# Patient Record
Sex: Male | Born: 1950 | Race: White | Hispanic: No | Smoking: Never smoker
Health system: Southern US, Community
[De-identification: ages and names within clinical notes are randomized; demographics above are authoritative.]

## PROBLEM LIST (undated history)

## (undated) DIAGNOSIS — E119 Type 2 diabetes mellitus without complications: Secondary | ICD-10-CM

## (undated) HISTORY — DX: Type 2 diabetes mellitus without complications: E11.9

## (undated) HISTORY — PX: ESOPHAGEAL DILATION: SHX303

---

## 2010-02-28 ENCOUNTER — Inpatient Hospital Stay (HOSPITAL_COMMUNITY): Admission: AC | Admit: 2010-02-28 | Discharge: 2010-03-03 | Payer: Self-pay | Source: Home / Self Care

## 2010-09-26 LAB — MRSA PCR SCREENING: MRSA by PCR: NEGATIVE

## 2010-09-26 LAB — TYPE AND SCREEN

## 2010-09-26 LAB — COMPREHENSIVE METABOLIC PANEL
AST: 132 U/L — ABNORMAL HIGH (ref 0–37)
Alkaline Phosphatase: 70 U/L (ref 39–117)
BUN: 11 mg/dL (ref 6–23)
BUN: 12 mg/dL (ref 6–23)
CO2: 19 meq/L (ref 19–32)
CO2: 21 mEq/L (ref 19–32)
Chloride: 106 mEq/L (ref 96–112)
Creatinine, Ser: 0.8 mg/dL (ref 0.4–1.5)
GFR calc non Af Amer: >60 mL/min (ref >60)
Glucose, Bld: 241 mg/dL — ABNORMAL HIGH (ref 70–99)
Sodium: 139 meq/L (ref 135–145)
Total Bilirubin: 1.2 mg/dL (ref 0.3–1.2)

## 2010-09-26 LAB — POCT I-STAT, CHEM 8
BUN: 12 mg/dL (ref 6–23)
BUN: 13 mg/dL (ref 6–23)
Calcium, Ion: 0.96 mmol/L — ABNORMAL LOW (ref 1.12–1.32)
Calcium, Ion: 0.96 mmol/L — ABNORMAL LOW (ref 1.12–1.32)
Chloride: 108 meq/L (ref 96–112)
Creatinine, Ser: 1.1 mg/dL (ref 0.4–1.5)
Creatinine, Ser: 1.1 mg/dL (ref 0.4–1.5)
Glucose, Bld: 236 mg/dL — ABNORMAL HIGH (ref 70–99)
Glucose, Bld: 241 mg/dL — ABNORMAL HIGH (ref 70–99)
HCT: 45 % (ref 39.0–52.0)
HCT: 47 % (ref 39.0–52.0)
Potassium: 3.4 meq/L — ABNORMAL LOW (ref 3.5–5.1)
Sodium: 141 meq/L (ref 135–145)
TCO2: 17 mmol/L (ref 0–100)

## 2010-09-26 LAB — CBC
Hemoglobin: 14.3 g/dL (ref 13.0–17.0)
Hemoglobin: 15.4 g/dL (ref 13.0–17.0)
MCH: 32.9 pg (ref 26.0–34.0)
MCHC: 36.4 g/dL — ABNORMAL HIGH (ref 30.0–36.0)
MCV: 90.4 fL (ref 78.0–100.0)
MCV: 91.7 fL (ref 78.0–100.0)
Platelets: 233 10*3/uL (ref 150–400)
Platelets: 247 10*3/uL (ref 150–400)
RBC: 4.35 MIL/uL (ref 4.22–5.81)
RBC: 4.68 MIL/uL (ref 4.22–5.81)

## 2010-09-26 LAB — GLUCOSE, CAPILLARY
Glucose-Capillary: 184 mg/dL — ABNORMAL HIGH (ref 70–99)
Glucose-Capillary: 212 mg/dL — ABNORMAL HIGH (ref 70–99)
Glucose-Capillary: 217 mg/dL — ABNORMAL HIGH (ref 70–99)
Glucose-Capillary: 253 mg/dL — ABNORMAL HIGH (ref 70–99)
Glucose-Capillary: 259 mg/dL — ABNORMAL HIGH (ref 70–99)
Glucose-Capillary: 260 mg/dL — ABNORMAL HIGH (ref 70–99)
Glucose-Capillary: 263 mg/dL — ABNORMAL HIGH (ref 70–99)
Glucose-Capillary: 275 mg/dL — ABNORMAL HIGH (ref 70–99)

## 2010-09-26 LAB — HEMOGLOBIN A1C: Mean Plasma Glucose: 166 mg/dL — ABNORMAL HIGH (ref <117)
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
## 2017-09-28 NOTE — Progress Notes (Deleted)
Christian Nguyen D.O. [facility] [address] [place], [place] [address] Phone: [phone] Subjective:    I'm seeing this patient by the request  of:    CC: ankle pain and swelling  OZH:[id number]:Subjective  Christian Nguyen is a 67 y.o. male coming in with complaint of ankle pain  Onset-  Location Duration-  Character- Aggravating factors- Reliving factors-  Therapies tried-  Severity-     No past medical history on file. *** The histories are not reviewed yet. Please review them in the "History" navigator section and refresh this SmartLink. Social History   Socioeconomic History  . Marital status: Divorced    Spouse name: Not on file  . Number of children: Not on file  . Years of education: Not on file  . Highest education level: Not on file  Social Needs  . Financial resource strain: Not on file  . Food insecurity - worry: Not on file  . Food insecurity - inability: Not on file  . Transportation needs - medical: Not on file  . Transportation needs - non-medical: Not on file  Occupational History  . Not on file  Tobacco Use  . Smoking status: Not on file  Substance and Sexual Activity  . Alcohol use: Not on file  . Drug use: Not on file  . Sexual activity: Not on file  Other Topics Concern  . Not on file  Social History Narrative  . Not on file   Allergies not on file No family history on file.   Past medical history, social, surgical and family history all reviewed in electronic medical record.  No pertanent information unless stated regarding to the chief complaint.   Review of Systems:Review of systems updated and as accurate as of 09/28/17  No headache, visual changes, nausea, vomiting, diarrhea, constipation, dizziness, abdominal pain, skin rash, fevers, chills, night sweats, weight loss, swollen lymph nodes, body aches, joint swelling, muscle aches, chest pain, shortness of breath, mood changes.   Objective  There were no vitals taken for this  visit. Systems examined below as of 09/28/17   General: No apparent distress alert and oriented x3 mood and affect normal, dressed appropriately.  HEENT: Pupils equal, extraocular movements intact  Respiratory: Patient's speak in full sentences and does not appear short of breath  Cardiovascular: No lower extremity edema, non tender, no erythema  Skin: Warm dry intact with no signs of infection or rash on extremities or on axial skeleton.  Abdomen: Soft nontender  Neuro: Cranial nerves II through XII are intact, neurovascularly intact in all extremities with 2+ DTRs and 2+ pulses.  Lymph: No lymphadenopathy of posterior or anterior cervical chain or axillae bilaterally.  Gait normal with good balance and coordination.  MSK:  Non tender with full range of motion and good stability and symmetric strength and tone of shoulders, elbows, wrist, hip, knee bilaterally.  Ankle: No visible erythema or swelling. Range of motion is full in all directions. Strength is 5/5 in all directions. Stable lateral and medial ligaments; squeeze test and kleiger test unremarkable; Talar dome nontender; No pain at base of 5th MT; No tenderness over cuboid; No tenderness over N spot or navicular prominence No tenderness on posterior aspects of lateral and medial malleolus No sign of peroneal tendon subluxations or tenderness to palpation Negative tarsal tunnel tinel's Able to walk 4 steps.  MSK US performed of: *** This study was ordered, performed, and interpreted by Terrilee FilesZach Alice Vitelli D.O.  Foot/Ankle:   All structures  visualized.   Talar dome unremarkable  Ankle mortise without effusion. Peroneus longus and brevis tendons unremarkable on long and transverse views without sheath effusions. Posterior tibialis, flexor hallucis longus, and flexor digitorum longus tendons unremarkable on long and transverse views without sheath effusions. Achilles tendon visualized along length of tendon and unremarkable on long and  transverse views without sheath effusion. Anterior Talofibular Ligament and Calcaneofibular Ligaments unremarkable and intact. Deltoid Ligament unremarkable and intact. Plantar fascia intact and without effusion, normal thickness. No increased doppler signal, cap sign, or thickening of tibial cortex. Power doppler signal normal.  IMPRESSION:  NORMAL ULTRASONOGRAPHIC EXAMINATION OF THE FOOT/ANKLE.    Impression and Recommendations:     This case required medical decision making of moderate complexity.      Note: This dictation was prepared with Dragon dictation along with smaller phrase technology. Any transcriptional errors that result from this process are unintentional.

## 2017-09-29 ENCOUNTER — Ambulatory Visit: Payer: Self-pay | Admitting: Family Medicine

## 2017-09-29 DIAGNOSIS — Z0289 Encounter for other administrative examinations: Secondary | ICD-10-CM

## 2017-10-14 ENCOUNTER — Ambulatory Visit: Payer: Self-pay | Admitting: Family Medicine

## 2017-11-23 NOTE — Progress Notes (Signed)
[name] [address] [place], [place] [address] Phone: [phone] Subjective:    I'm seeing this patient by the request  of:    CC:   ION:[id number]  Christian Nguyen is a 67 y.o. male coming in with complaint of right ankle pain. He has been having pain for over 1 year. Patient plays tennis and basketball and occasionally rolls his ankles he states. Patient feels that he is still having some swelling from an injury in January while playing tennis and jammed his foot into the floor. He turned his ankle 3 weeks ago while playing golf when he stepped into a drainage hole. He has been able to continue with physical activity since the injury. Pain occurs on lateral malleolous.        History reviewed. No pertinent past medical history. History reviewed. No pertinent surgical history. Social History   Socioeconomic History  . Marital status: Divorced    Spouse name: Not on file  . Number of children: Not on file  . Years of education: Not on file  . Highest education level: Not on file  Occupational History  . Not on file  Social Needs  . Financial resource strain: Not on file  . Food insecurity:    Worry: Not on file    Inability: Not on file  . Transportation needs:    Medical: Not on file    Non-medical: Not on file  Tobacco Use  . Smoking status: Not on file  Substance and Sexual Activity  . Alcohol use: Not on file  . Drug use: Not on file  . Sexual activity: Not on file  Lifestyle  . Physical activity:    Days per week: Not on file    Minutes per session: Not on file  . Stress: Not on file  Relationships  . Social connections:    Talks on phone: Not on file    Gets together: Not on file    Attends religious service: Not on file    Active member of club or organization: Not on file    Attends meetings of clubs or organizations: Not on file    Relationship status: Not on file  Other Topics Concern  . Not on file  Social History  Narrative  . Not on file   Not on File History reviewed. No pertinent family history.  No family history of autoimmune   Past medical history, social, surgical and family history all reviewed in electronic medical record.  No pertanent information unless stated regarding to the chief complaint.   Review of Systems:Review of systems updated and as accurate as of 11/24/17  No headache, visual changes, nausea, vomiting, diarrhea, constipation, dizziness, abdominal pain, skin rash, fevers, chills, night sweats, weight loss, swollen lymph nodes, body aches, joint swelling, muscle aches, chest pain, shortness of breath, mood changes.   Objective  Blood pressure 132/82, pulse 94, height  (1.88 m), weight 182 lb (82.6 kg), SpO2 97 %. Systems examined below as of 11/24/17   General: No apparent distress alert and oriented x3 mood and affect normal, dressed appropriately.  HEENT: Pupils equal, extraocular movements intact  Respiratory: Patient's speak in full sentences and does not appear short of breath  Cardiovascular: No lower extremity edema, non tender, no erythema  Skin: Warm dry intact with no signs of infection or rash on extremities or on axial skeleton.  Abdomen: Soft nontender  Neuro: Cranial nerves II through XII are intact, neurovascularly  intact in all extremities with 2+ DTRs and 2+ pulses.  Lymph: No lymphadenopathy of posterior or anterior cervical chain or axillae bilaterally.  Gait normal with good balance and coordination.  MSK:  Non tender with full range of motion and good stability and symmetric strength and tone of shoulders, elbows, wrist, hip, knee bilaterally.  Right ankle exam shows that there is hypertrophy of both the medial and lateral malleolus compared to the contralateral side.  Medial elbow malleolus is significantly larger.  Tender to palpation to percussion in this area as well.  Patient's ankle does have some mild limited range of motion in dorsiflexion and  plantarflexion lacking last 2 to 5 degrees.  Patient also has some very mild instability on the lateral aspect of the ankle.  Achilles is intact and nontender.  Neurovascular intact distally.  Limited musculoskeletal ultrasound was performed and interpreted by Judi Saa  Limited ultrasound of patient's ankle shows that there is significant calcific changes with what appears to be either a nonhealing fracture versus overlying uric acid or calcium deposits.  Increasing Doppler flow noted in the ankle joint space. Impression: Atypical presentation of the medial malleolus   [id number]; 15 additional minutes spent for Therapeutic exercises as stated in above notes.  This included exercises focusing on stretching, strengthening, with significant focus on eccentric aspects.   Long term goals include an improvement in range of motion, strength, endurance as well as avoiding reinjury. Patient's frequency would include in 1-2 times a day, 3-5 times a week for a duration of 6-12 weeks. Ankle strengthening that included:  Basic range of motion exercises to allow proper full motion at ankle Stretching of the lower leg and hamstrings  Theraband exercises for the lower leg - inversion, eversion, dorsiflexion and plantarflexion each to be completed with a theraband Balance exercises to increase proprioception Weight bearing exercises to increase strength and balance  Proper technique shown and discussed handout in great detail with ATC.  All questions were discussed and answered.     Impression and Recommendations:     This case required medical decision making of moderate complexity.      Note: This dictation was prepared with Dragon dictation along with smaller phrase technology. Any transcriptional errors that result from this process are unintentional.

## 2017-11-24 ENCOUNTER — Encounter: Payer: Self-pay | Admitting: Family Medicine

## 2017-11-24 ENCOUNTER — Ambulatory Visit: Payer: Self-pay

## 2017-11-24 ENCOUNTER — Ambulatory Visit (INDEPENDENT_AMBULATORY_CARE_PROVIDER_SITE_OTHER)
Admission: RE | Admit: 2017-11-24 | Discharge: 2017-11-24 | Disposition: A | Discharge status: Home / Self Care | Payer: Medicare Other | Source: Ambulatory Visit | Attending: Family Medicine | Admitting: Family Medicine

## 2017-11-24 ENCOUNTER — Ambulatory Visit (INDEPENDENT_AMBULATORY_CARE_PROVIDER_SITE_OTHER): Payer: Medicare Other | Admitting: Family Medicine

## 2017-11-24 VITALS — BP 132/82 | HR 94 | Ht 74.0 in | Wt 182.0 lb

## 2017-11-24 DIAGNOSIS — G8929 Other chronic pain: Secondary | ICD-10-CM

## 2017-11-24 DIAGNOSIS — M25571 Pain in right ankle and joints of right foot: Secondary | ICD-10-CM

## 2017-11-24 DIAGNOSIS — M25371 Other instability, right ankle: Secondary | ICD-10-CM | POA: Insufficient documentation

## 2017-11-24 MED ORDER — VITAMIN D (ERGOCALCIFEROL) 1.25 MG (50000 UNIT) PO CAPS: 50000.0000 [IU] | ORAL_CAPSULE | ORAL | 0 refills | Status: DC

## 2017-11-24 NOTE — Assessment & Plan Note (Signed)
Patient does have some instability as well as some abnormality noted on physical exam today.  Patient does have hypertrophy of the bone at this point and x-rays are pending.  Ultrasound appeared to be more of either calcium deposits versus possible uric acid deposits as well as possibly a chronic nonhealing fracture.  Patient had very similar presentation on the lateral aspect of the ankle as well.  Once weekly vitamin D given, discussed topical anti-inflammatories, discussed home exercise.  Follow-up again 4 weeks

## 2017-11-24 NOTE — Patient Instructions (Addendum)
Good to see you  Ice 20 minutes 2 times daily. Usually after activity and before bed. pennsaid pinkie amount topically 2 times daily as needed.  Wear brace with a lot of activity  [employer] orthotics "total support" online would be great  Once weekly vitamin D for 12 weeks Exercises 3 times a week.   See me again in 4 weeks

## 2017-12-02 ENCOUNTER — Ambulatory Visit: Payer: Self-pay | Admitting: Family Medicine

## 2017-12-28 NOTE — Progress Notes (Signed)
[name] [address] [place], [place] [address] Phone: [phone] Subjective:     CC: Right ankle pain follow-up  OZH:YQMVHQIONG  Christian Nguyen is a 67 y.o. male coming in with complaint of right ankle pain.  Had abnormality of the ankle noted.  Patient on ultrasound did have some increasing Doppler flow and possible soft tissue changes.  Had significant swelling.  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  Patient states though that with the topical anti-inflammatories, icing regimen, home exercises has improved significantly.  Patient has had no pain with any of her as his activities including tennis.  Has been doing somewhat some bracing that has been helpful.  Rates severity of pain is 0 out of 10    No past medical history on file. No past surgical history on file. Social History   Socioeconomic History  . Marital status: Divorced    Spouse name: Not on file  . Number of children: Not on file  . Years of education: Not on file  . Highest education level: Not on file  Occupational History  . Not on file  Social Needs  . Financial resource strain: Not on file  . Food insecurity:    Worry: Not on file    Inability: Not on file  . Transportation needs:    Medical: Not on file    Non-medical: Not on file  Tobacco Use  . Smoking status: Not on file  Substance and Sexual Activity  . Alcohol use: Not on file  . Drug use: Not on file  . Sexual activity: Not on file  Lifestyle  . Physical activity:    Days per week: Not on file    Minutes per session: Not on file  . Stress: Not on file  Relationships  . Social connections:    Talks on phone: Not on file    Gets together: Not on file    Attends religious service: Not on file    Active member of club or organization: Not on file    Attends meetings of clubs or organizations: Not on file    Relationship status: Not on  file  Other Topics Concern  . Not on file  Social History Narrative  . Not on file   Not on File No family history on file.  No family history of autoimmune   Past medical history, social, surgical and family history all reviewed in electronic medical record.  No pertanent information unless stated regarding to the chief complaint.   Review of Systems:Review of systems updated and as accurate as of 12/29/17  No headache, visual changes, nausea, vomiting, diarrhea, constipation, dizziness, abdominal pain, skin rash, fevers, chills, night sweats, weight loss, swollen lymph nodes, body aches, joint swelling,  chest pain, shortness of breath, mood changes.  Positive muscle aches  Objective  Blood pressure 130/74, pulse 87, height 6\' 2"  (1.88 m), weight 186 lb (84.4 kg), SpO2 96 %. Systems examined below as of 12/29/17   General: No apparent distress alert and oriented x3 mood and affect normal, dressed appropriately.  HEENT: Pupils equal, extraocular movements intact  Respiratory: Patient's speak in full sentences and does not appear short of breath  Cardiovascular: No lower extremity edema, non tender, no erythema  Skin: Warm dry intact with no signs of infection or rash on extremities or on axial skeleton.  Abdomen: Soft nontender  Neuro:  Cranial nerves II through XII are intact, neurovascularly intact in all extremities with 2+ DTRs and 2+ pulses.  Lymph: No lymphadenopathy of posterior or anterior cervical chain or axillae bilaterally.  Gait mild antalgic MSK:  Non tender with full range of motion and good stability and symmetric strength and tone of shoulders, elbows, wrist, hip, knee bilaterally.  Right ankle still shows significant bony abnormality noted of the medial aspect of the ankle.  Patient is minimally tender in that area.  Mild loss of range of motion in all planes of the ankle.  Mild crepitus noted.  Neurovascularly intact distally.  Achilles is without tenderness.     Impression and Recommendations:     This case required medical decision making of moderate complexity.      Note: This dictation was prepared with Dragon dictation along with smaller phrase technology. Any transcriptional errors that result from this process are unintentional.

## 2017-12-29 ENCOUNTER — Ambulatory Visit (INDEPENDENT_AMBULATORY_CARE_PROVIDER_SITE_OTHER): Payer: Medicare Other | Admitting: Family Medicine

## 2017-12-29 DIAGNOSIS — M25371 Other instability, right ankle: Secondary | ICD-10-CM | POA: Diagnosis not present

## 2017-12-29 NOTE — Assessment & Plan Note (Signed)
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  Patient did have some soft tissue swelling but has improved since patient's previous visit.  This would go against some type of soft tissue mass and no mass completely appreciated on the ultrasound.  Discussed that possible MRI would need to be done for further evaluation but with patient doing so well at this time we will continue to monitor.  No change in management and follow-up as needed

## 2017-12-29 NOTE — Patient Instructions (Addendum)
See me when you need me  Around [place] would do [address]  Also [place]  [place]  [place] [facility]   See me when you need me   [email or web address]

## 2018-04-15 ENCOUNTER — Other Ambulatory Visit: Payer: Self-pay | Admitting: Family Medicine

## 2018-04-15 NOTE — Telephone Encounter (Signed)
Refill done.  

## 2022-04-24 ENCOUNTER — Emergency Department (HOSPITAL_COMMUNITY)
Admission: EM | Admit: 2022-04-24 | Discharge: 2022-04-24 | Discharge status: Against Medical Advice | Payer: Medicare Other | Attending: Student | Admitting: Student

## 2022-04-24 ENCOUNTER — Emergency Department (HOSPITAL_COMMUNITY): Payer: Medicare Other

## 2022-04-24 ENCOUNTER — Encounter (HOSPITAL_COMMUNITY): Payer: Self-pay

## 2022-04-24 ENCOUNTER — Other Ambulatory Visit: Payer: Self-pay

## 2022-04-24 DIAGNOSIS — X58XXXA Exposure to other specified factors, initial encounter: Secondary | ICD-10-CM | POA: Diagnosis not present

## 2022-04-24 DIAGNOSIS — Z5321 Procedure and treatment not carried out due to patient leaving prior to being seen by health care provider: Secondary | ICD-10-CM | POA: Diagnosis not present

## 2022-04-24 DIAGNOSIS — T18120A Food in esophagus causing compression of trachea, initial encounter: Secondary | ICD-10-CM | POA: Insufficient documentation

## 2022-04-24 LAB — CBG MONITORING, ED: Glucose-Capillary: 207 mg/dL — ABNORMAL HIGH (ref 70–99)

## 2022-04-24 NOTE — ED Provider Triage Note (Signed)
Emergency Medicine Provider Triage Evaluation Note  Christian Nguyen , a 71 y.o. male  was evaluated in triage.  Pt complains of possible food bolus stuck in esophagus.  Patient states that he was eating flounder last night.  He states he was eating a bonier part of the fish but does not feel he swallowed a bone.  He states that he has had history of difficulty swallowing in the past with pieces of chicken and other items.  He states that this feels like initially it was stuck in the upper portion of his throat and he tried to drink water to push it down.  He states he has been unable to drink any water or consume any food since this happened.  He states now that it feels that it is moved further down and points to his upper chest just above the sternum.  He is able to breathe freely and talk in complete sentences  Review of Systems  Positive: As above Negative: As above  Physical Exam  BP (!) 169/108 (BP Location: Right Arm)   Pulse 94   Temp 99.1 F (37.3 C) (Oral)   Resp 18   SpO2 97%  Gen:   Awake, no distress   Resp:  Normal effort  MSK:   Moves extremities without difficulty  Other:    Medical Decision Making  Medically screening exam initiated at 11:37 AM.  Appropriate orders placed.  Alif A Pingree was informed that the remainder of the evaluation will be completed by another provider, this initial triage assessment does not replace that evaluation, and the importance of remaining in the ED until their evaluation is complete.     Dorothyann Peng, PA-C 04/24/22 1139

## 2022-04-24 NOTE — ED Notes (Signed)
Pt called multiple times with no answer.

## 2022-04-24 NOTE — ED Notes (Signed)
Pt decided to leave. Pt seen walking out discussed if s/s return

## 2022-04-24 NOTE — ED Triage Notes (Signed)
Pt states he was eating fish last night when he felt it got stuck. Pt reports spitting up every 30 min. Pt unable to drink water without spitting it back up. Pt with hx of esophageal stricture and having it stretched in the past. No difficulty breathing or airway involvement during triage.
